# Patient Record
Sex: Male | Born: 1990 | Race: Asian | Marital: Single | Smoking: Never smoker
Health system: Southern US, Community
[De-identification: ages and names within clinical notes are randomized; demographics above are authoritative.]

---

## 2013-07-12 ENCOUNTER — Ambulatory Visit (INDEPENDENT_AMBULATORY_CARE_PROVIDER_SITE_OTHER): Payer: BC Managed Care – PPO | Admitting: Physician Assistant

## 2013-07-12 VITALS — BP 122/78 | HR 74 | Temp 98.0°F | Resp 17 | Ht 70.0 in | Wt 154.0 lb

## 2013-07-12 DIAGNOSIS — S51002A Unspecified open wound of left elbow, initial encounter: Secondary | ICD-10-CM

## 2013-07-12 DIAGNOSIS — S51009A Unspecified open wound of unspecified elbow, initial encounter: Secondary | ICD-10-CM

## 2013-07-12 DIAGNOSIS — M79609 Pain in unspecified limb: Secondary | ICD-10-CM

## 2013-07-12 DIAGNOSIS — Z23 Encounter for immunization: Secondary | ICD-10-CM

## 2013-07-12 NOTE — Patient Instructions (Signed)

## 2013-07-12 NOTE — Progress Notes (Signed)
  Subjective:    Patient ID: Paul Conley, male    DOB: 12-10-1990, 22 y.o.   MRN: [id number]  HPI 22 year old male presents for evaluation of left elbow injury. This morning around 3:00 a.m he hit his elbow against a glass door. Glass broke and cut his arm.  He wrapped it in gauze overnight and this morning decided to get checked out because he was concerned he may need stitches.  No pain with ROM, elbow pain,weakness, or numbness.  Tetanus status unknown.   Patient is otherwise healthy with no other concerns today.     Review of Systems  Musculoskeletal: Negative for arthralgias and joint swelling.  Skin: Positive for wound.  Neurological: Negative for dizziness and headaches.       Objective:   Physical Exam  Constitutional: He is oriented to person, place, and time. He appears well-developed and well-nourished.  HENT:  Head: Normocephalic and atraumatic.  Right Ear: External ear normal.  Left Ear: External ear normal.  Eyes: Conjunctivae are normal.  Neck: Normal range of motion.  Cardiovascular: Normal rate.   Pulmonary/Chest: Effort normal.  Musculoskeletal:       Left elbow: He exhibits laceration (1.5 cm laceration to olecranon). He exhibits normal range of motion, no swelling and no effusion. No tenderness found.  Neurological: He is alert and oriented to person, place, and time.  Psychiatric: He has a normal mood and affect. His behavior is normal. Judgment and thought content normal.     VCO. Local anesthesia with 2% lidocaine with epinephrine. Cleaned with soap and water. SP.  Wound explored revealing no FB or deep structure involvement. Repaired with #2 HM and #1 SI using 4-0 ethilon. Cleaned and bandaged.  Patient tolerated well.      Assessment & Plan:   Wound, open, elbow, left, initial encounter  Need for Tdap vaccination - Plan: Tdap vaccine greater than or equal to 7yo IM  Wound care instructions provided Return in 10 days for suture removal, sooner if  needed.

## 2013-07-23 ENCOUNTER — Ambulatory Visit (INDEPENDENT_AMBULATORY_CARE_PROVIDER_SITE_OTHER): Payer: BC Managed Care – PPO | Admitting: Physician Assistant

## 2013-07-23 VITALS — BP 100/66 | HR 59 | Temp 98.0°F | Resp 16 | Ht 70.0 in | Wt 155.2 lb

## 2013-07-23 DIAGNOSIS — Z4802 Encounter for removal of sutures: Secondary | ICD-10-CM

## 2013-07-23 NOTE — Progress Notes (Signed)
Patient comes into the clinic today to have sutures removed from his left anterior elbow.  No signs of infection. Area is dry and well healed. #3 sutures intact.  #3 sutures removed. Pt tolerated well. Bandaid refused. Pt educated on wound care. Advised to rtc if needed.

## 2013-07-23 NOTE — Progress Notes (Signed)
   Patient ID: Paul Conley MRN: [id number], DOB: August 19, 1991 22 y.o. Date of Encounter: 07/23/2013, 2:53 PM  Primary Physician: No primary provider on file.  Chief Complaint: Suture removal    See note from 07/12/13  HPI: 22 y.o. male with injury to left proximal elbow Here for suture removal s/p placement on 07/12/13 Doing well No issues/complaints Afebrile/ No chills No erythema No pain Starting to itch Able to move without difficulty Normal sensation  History reviewed. No pertinent past medical history.   Home Meds: Prior to Admission medications   Not on File    Allergies: No Known Allergies  Physical Exam: Blood pressure 100/66, pulse 59, temperature 98 F (36.7 C), temperature source Oral, resp. rate 16, height 5\' 10"  (1.778 m), weight 155 lb 3.2 oz (70.398 kg), SpO2 100.00%., Body mass index is 22.27 kg/(m^2). General: Well developed, well nourished, in no acute distress. Head: Normocephalic, atraumatic, sclera non-icteric, no xanthomas, nares are without discharge.  Neck: Supple. Lungs: Breathing is unlabored. Heart: Normal rate. Msk:  Strength and tone appear normal for age. Wound: Wound well healed without erythema, swelling, or tenderness to palpation. FROM and 5/5 strength with normal sensation throughout. Slight FB reaction.  Skin: See above, otherwise dry without rash or erythema. Extremities: No clubbing or cyanosis. No edema. Neuro: Alert and oriented X 3. Moves all extremities spontaneously.  Psych:  Responds to questions appropriately with a normal affect.   PROCEDURE: Verbal consent obtained. 2 HM and 1 SI sutures removed by Elease Etienne.  Assessment and Plan: 22 y.o. male here for suture removal for wound described above. -Sutures removed per above -Wound resolved -RTC prn  Signed, Eula Listen, PA-C [facility] [place], [place] [address] [phone] 07/23/2013 2:53 PM

## 2016-03-07 ENCOUNTER — Ambulatory Visit (INDEPENDENT_AMBULATORY_CARE_PROVIDER_SITE_OTHER): Payer: BLUE CROSS/BLUE SHIELD

## 2016-03-07 ENCOUNTER — Ambulatory Visit (INDEPENDENT_AMBULATORY_CARE_PROVIDER_SITE_OTHER): Payer: BLUE CROSS/BLUE SHIELD | Admitting: Physician Assistant

## 2016-03-07 VITALS — BP 118/70 | HR 69 | Temp 97.9°F | Resp 17 | Ht 70.0 in | Wt 159.0 lb

## 2016-03-07 DIAGNOSIS — S301XXA Contusion of abdominal wall, initial encounter: Secondary | ICD-10-CM

## 2016-03-07 DIAGNOSIS — M25551 Pain in right hip: Secondary | ICD-10-CM

## 2016-03-07 DIAGNOSIS — M25511 Pain in right shoulder: Secondary | ICD-10-CM

## 2016-03-07 DIAGNOSIS — S46911A Strain of unspecified muscle, fascia and tendon at shoulder and upper arm level, right arm, initial encounter: Secondary | ICD-10-CM

## 2016-03-07 LAB — POCT URINALYSIS DIP (MANUAL ENTRY)
BILIRUBIN UA: NEGATIVE
Blood, UA: NEGATIVE
GLUCOSE UA: NEGATIVE
LEUKOCYTES UA: NEGATIVE
Nitrite, UA: NEGATIVE
Protein Ur, POC: 30 — AB
Spec Grav, UA: 1.02
Urobilinogen, UA: 1
pH, UA: 7

## 2016-03-07 MED ORDER — CYCLOBENZAPRINE HCL 5 MG PO TABS: 5.0000 mg | ORAL_TABLET | Freq: Three times a day (TID) | ORAL | Status: DC | PRN

## 2016-03-07 MED ORDER — MELOXICAM 15 MG PO TABS: 15.0000 mg | ORAL_TABLET | Freq: Every day | ORAL | Status: DC
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
## 2016-09-25 IMAGING — DX DG HIP (WITH OR WITHOUT PELVIS) 2-3V*R*
3 series · 3 of 3 positions shown · non-contrast
Comparison: None.

CLINICAL DATA: Right hip pain, chronicity uncertain

EXAM:
DG HIP (WITH OR WITHOUT PELVIS) 2-3V RIGHT

[pelvis ap]
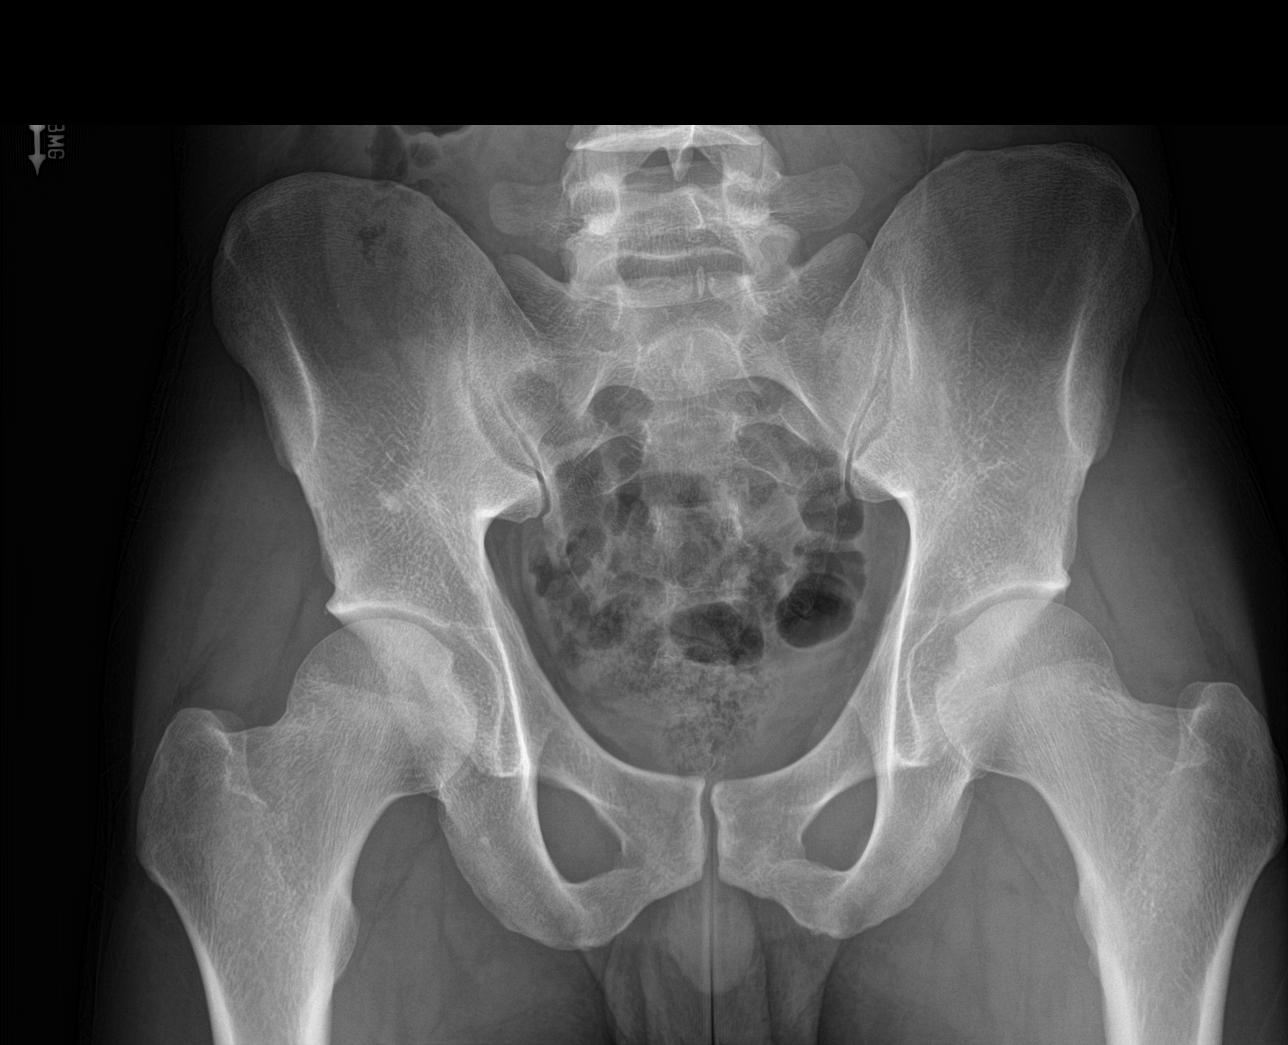

[hip ap]
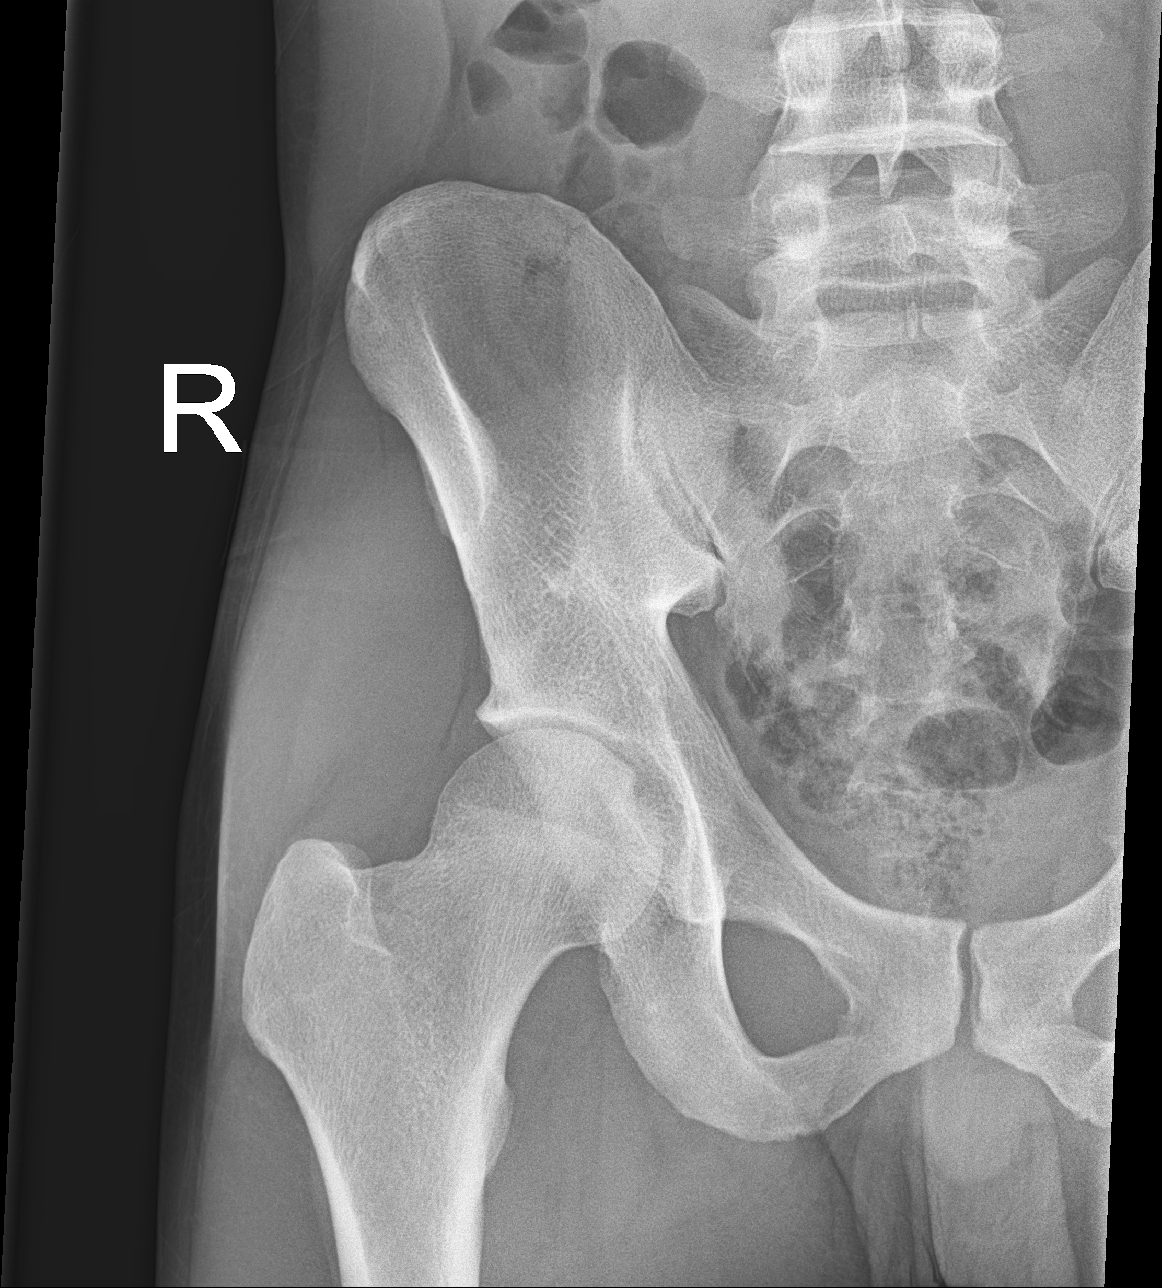

[hip lat]
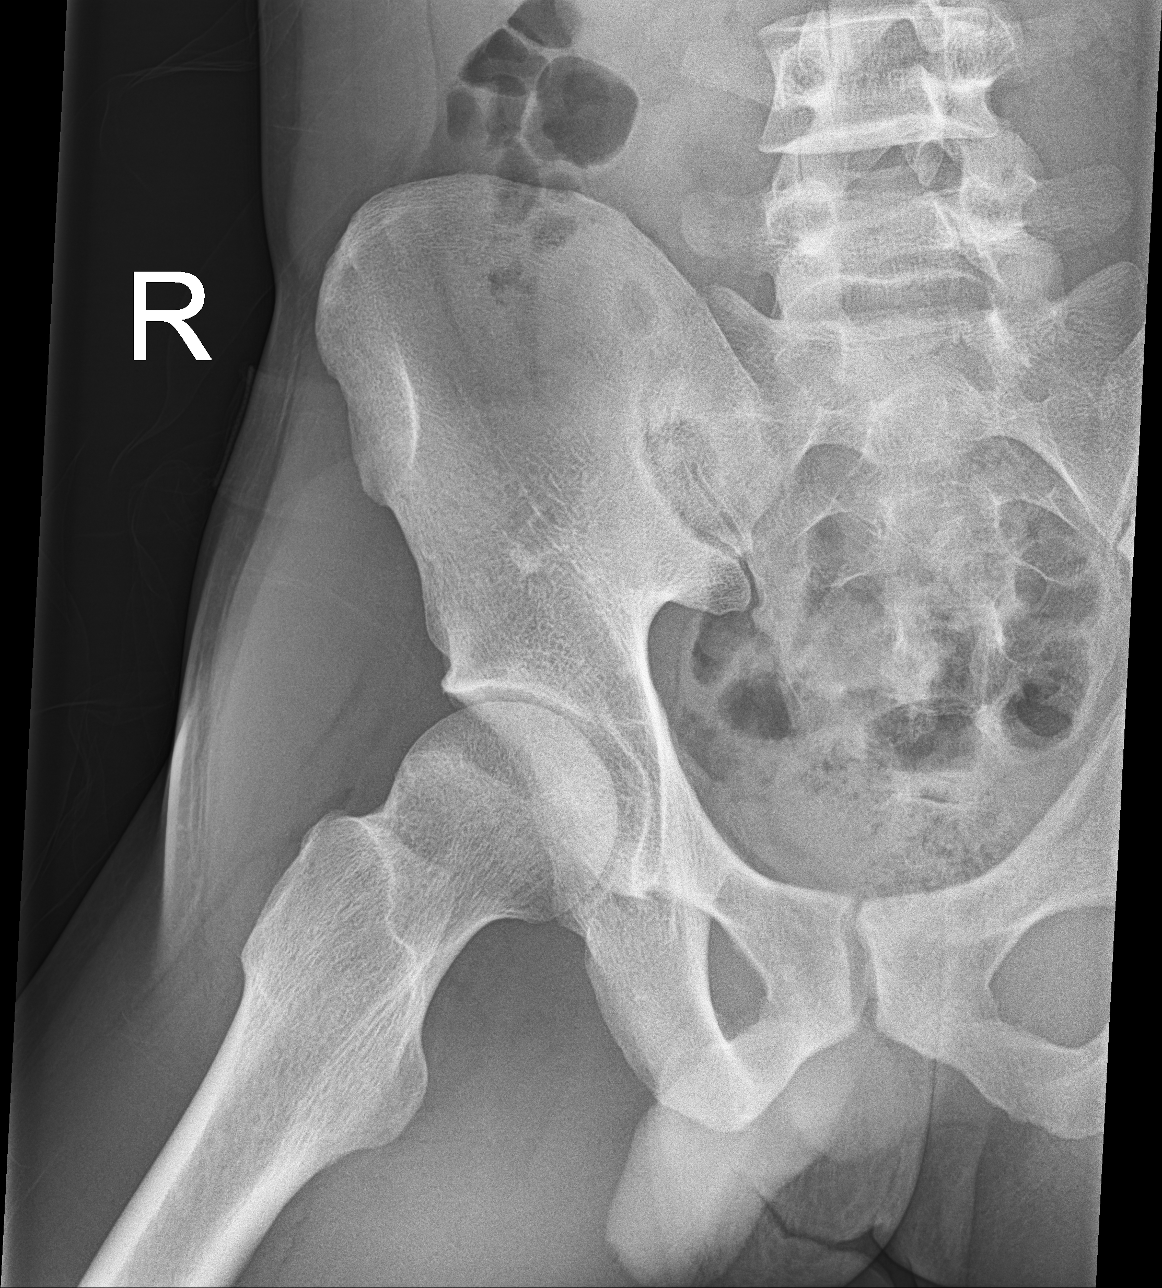

[3 of 3 positions shown; findings below may reference images not displayed]

FINDINGS: There is no evidence of hip fracture or dislocation. There is no
evidence of arthropathy or other focal bone abnormality.
IMPRESSION: Negative.

## 2019-11-17 ENCOUNTER — Ambulatory Visit: Payer: Self-pay

## 2024-10-09 ENCOUNTER — Emergency Department (HOSPITAL_BASED_OUTPATIENT_CLINIC_OR_DEPARTMENT_OTHER): Payer: Self-pay

## 2024-10-09 ENCOUNTER — Encounter (HOSPITAL_BASED_OUTPATIENT_CLINIC_OR_DEPARTMENT_OTHER): Payer: Self-pay | Admitting: Emergency Medicine

## 2024-10-09 ENCOUNTER — Emergency Department (HOSPITAL_BASED_OUTPATIENT_CLINIC_OR_DEPARTMENT_OTHER)
Admission: EM | Admit: 2024-10-09 | Discharge: 2024-10-10 | Disposition: A | Discharge status: Home / Self Care | Payer: Self-pay | Attending: Emergency Medicine | Admitting: Emergency Medicine

## 2024-10-09 ENCOUNTER — Other Ambulatory Visit: Payer: Self-pay

## 2024-10-09 DIAGNOSIS — S62357A Nondisplaced fracture of shaft of fifth metacarpal bone, left hand, initial encounter for closed fracture: Secondary | ICD-10-CM | POA: Insufficient documentation

## 2024-10-09 DIAGNOSIS — M7989 Other specified soft tissue disorders: Secondary | ICD-10-CM | POA: Insufficient documentation

## 2024-10-09 DIAGNOSIS — S62357B Nondisplaced fracture of shaft of fifth metacarpal bone, left hand, initial encounter for open fracture: Secondary | ICD-10-CM

## 2024-10-09 DIAGNOSIS — W010XXA Fall on same level from slipping, tripping and stumbling without subsequent striking against object, initial encounter: Secondary | ICD-10-CM | POA: Insufficient documentation

## 2024-10-09 NOTE — ED Triage Notes (Signed)
 Patient here with bruising, swelling to the L hand- knuckle near the pinky.

## 2024-10-10 MED ORDER — CEPHALEXIN 500 MG PO CAPS: 500.0000 mg | ORAL_CAPSULE | Freq: Four times a day (QID) | ORAL | 0 refills | Status: AC

## 2024-10-10 MED ORDER — OXYCODONE-ACETAMINOPHEN 5-325 MG PO TABS
1.0000 | ORAL_TABLET | Freq: Once | ORAL | Status: AC
  Administered 2024-10-10: 1 via ORAL
  Filled 2024-10-10: qty 1

## 2024-10-10 MED ORDER — OXYCODONE-ACETAMINOPHEN 5-325 MG PO TABS: 1.0000 | ORAL_TABLET | Freq: Four times a day (QID) | ORAL | 0 refills | Status: AC | PRN

## 2024-10-10 MED ORDER — CEPHALEXIN 250 MG PO CAPS
500.0000 mg | ORAL_CAPSULE | Freq: Once | ORAL | Status: AC
  Administered 2024-10-10: 500 mg via ORAL
  Filled 2024-10-10: qty 2

## 2024-10-10 MED ORDER — TETANUS-DIPHTH-ACELL PERTUSSIS 5-2-15.5 LF-MCG/0.5 IM SUSP
0.5000 mL | Freq: Once | INTRAMUSCULAR | Status: AC
  Administered 2024-10-10: 0.5 mL via INTRAMUSCULAR
  Filled 2024-10-10: qty 0.5

## 2024-10-10 MED ORDER — OXYCODONE-ACETAMINOPHEN 5-325 MG PO TABS
2.0000 | ORAL_TABLET | Freq: Once | ORAL | Status: AC
  Administered 2024-10-10: 1 via ORAL
  Filled 2024-10-10: qty 2

## 2024-10-10 NOTE — ED Provider Notes (Signed)
 " [place] [facility] Provider Note   CSN: [id number] Arrival date & time: 10/09/24  2338     Patient presents with: Hand Injury   Paul Conley is a 34 y.o. male.   Patient is a 34 year old male with no significant past medical history.  Patient presenting with a left hand injury.  He reports slipping on ice and falling, then injuring his left hand.  He has a small laceration noted above the distal end of the fifth metacarpal.  Bleeding controlled with direct pressure.       Prior to Admission medications  Medication Sig Start Date End Date Taking? Authorizing Provider  cyclobenzaprine  (FLEXERIL ) 5 MG tablet Take 1-2 tablets (5-10 mg total) by mouth 3 (three) times daily as needed for muscle spasms. 03/07/16   Isadora Krabbe D, PA  meloxicam  (MOBIC ) 15 MG tablet Take 1 tablet (15 mg total) by mouth daily. 03/07/16   Isadora Krabbe D, PA    Allergies: Patient has no known allergies.    Review of Systems  All other systems reviewed and are negative.   Updated Vital Signs BP 106/76   Pulse 73   Temp (!) 97.5 F (36.4 C) (Oral)   Resp 16   Ht 5' 10 (1.778 m)   Wt 81.6 kg   SpO2 99%   BMI 25.83 kg/m   Physical Exam Vitals and nursing note reviewed.  HENT:     Head: Normocephalic.  Pulmonary:     Effort: Pulmonary effort is normal.  Musculoskeletal:     Comments: There is swelling and deformity noted overlying the distal fifth metacarpal.  There is a small puncture that is continuously oozing blood.  Skin:    General: Skin is warm and dry.  Neurological:     Mental Status: He is alert and oriented to person, place, and time.     (all labs ordered are listed, but only abnormal results are displayed) Labs Reviewed - No data to display  EKG: None  Radiology: DG Hand Complete Left Result Date: 10/10/2024 EXAM: 3 OR MORE VIEW(S) XRAY OF THE LEFT HAND 10/09/2024 11:58:00 PM COMPARISON: None available. CLINICAL HISTORY:  Left hand injury. FINDINGS: BONES AND JOINTS: Acute volarly displaced and angulated comminuted fracture of the fifth metacarpal neck and distal body. SOFT TISSUES: Emphysema in the overlying soft tissues compatible with laceration. Overlying soft tissue edema. IMPRESSION: 1. Open acute volarly displaced and angulated comminuted fracture of the fifth metacarpal neck and distal body. 2. Soft tissue emphysema in the overlying tissues consistent with laceration, with overlying soft tissue edema. Electronically signed by: Morgane Naveau MD 10/10/2024 12:02 AM EST RP Workstation: [id number]     Procedures   Medications Ordered in the ED  oxyCODONE -acetaminophen  (PERCOCET/ROXICET) 5-325 MG per tablet 2 tablet (has no administration in time range)                                    Medical Decision Making Amount and/or Complexity of Data Reviewed Radiology: ordered.  Risk Prescription drug management.   Patient presenting with a left hand injury, the events of which are described in the HPI.  His x-rays show an angulated fracture of the fifth metacarpal.  There is a puncture wound overlying the fracture consistent with an open fracture.  This was discussed with Dr. Lorretta from hand surgery.  He has recommended antibiotics, splinting, and follow-up in his office  in the morning.     Final diagnoses:  None    ED Discharge Orders     None          Geroldine Berg, MD 10/10/24 [phone]  "

## 2024-10-10 NOTE — Discharge Instructions (Addendum)
 Begin taking Keflex  as prescribed.  Begin taking Percocet as prescribed as needed for pain.  Follow-up with hand surgery this morning.  The contact information for Dr. Lorretta has been provided in this discharge summary for you to call this morning and make these arrangements.  Ice for 20 minutes every 2 hours while awake for the next 2 days.

## 2024-10-22 ENCOUNTER — Ambulatory Visit (HOSPITAL_COMMUNITY): Admission: RE | Admit: 2024-10-22 | Payer: Self-pay | Source: Home / Self Care | Admitting: Orthopedic Surgery

## 2024-10-22 ENCOUNTER — Encounter (HOSPITAL_COMMUNITY): Admission: RE | Payer: Self-pay | Source: Home / Self Care
